# Patient Record
Sex: Female | Born: 1970 | Race: Black or African American | Hispanic: No | Marital: Single | Smoking: Never smoker
Health system: Southern US, Community
[De-identification: ages and names within clinical notes are randomized; demographics above are authoritative.]

## PROBLEM LIST (undated history)

## (undated) DIAGNOSIS — R51 Headache: Secondary | ICD-10-CM

## (undated) DIAGNOSIS — R7611 Nonspecific reaction to tuberculin skin test without active tuberculosis: Secondary | ICD-10-CM

## (undated) DIAGNOSIS — A6000 Herpesviral infection of urogenital system, unspecified: Secondary | ICD-10-CM

## (undated) DIAGNOSIS — R011 Cardiac murmur, unspecified: Secondary | ICD-10-CM

## (undated) DIAGNOSIS — K589 Irritable bowel syndrome without diarrhea: Secondary | ICD-10-CM

## (undated) DIAGNOSIS — R519 Headache, unspecified: Secondary | ICD-10-CM

## (undated) DIAGNOSIS — T7840XA Allergy, unspecified, initial encounter: Secondary | ICD-10-CM

## (undated) HISTORY — DX: Headache: R51

## (undated) HISTORY — DX: Cardiac murmur, unspecified: R01.1

## (undated) HISTORY — DX: Allergy, unspecified, initial encounter: T78.40XA

## (undated) HISTORY — PX: DILATION AND CURETTAGE OF UTERUS: SHX78

## (undated) HISTORY — DX: Headache, unspecified: R51.9

## (undated) HISTORY — DX: Nonspecific reaction to tuberculin skin test without active tuberculosis: R76.11

---

## 2018-02-03 ENCOUNTER — Ambulatory Visit: Payer: BC Managed Care – PPO | Admitting: Family Medicine

## 2018-02-03 VITALS — BP 102/78 | HR 54 | Temp 97.9°F | Ht 66.0 in | Wt 156.0 lb

## 2018-02-03 DIAGNOSIS — J302 Other seasonal allergic rhinitis: Secondary | ICD-10-CM | POA: Diagnosis not present

## 2018-02-03 DIAGNOSIS — Z9229 Personal history of other drug therapy: Secondary | ICD-10-CM

## 2018-02-03 DIAGNOSIS — Z8679 Personal history of other diseases of the circulatory system: Secondary | ICD-10-CM | POA: Diagnosis not present

## 2018-02-03 DIAGNOSIS — Z131 Encounter for screening for diabetes mellitus: Secondary | ICD-10-CM

## 2018-02-03 DIAGNOSIS — Z23 Encounter for immunization: Secondary | ICD-10-CM

## 2018-02-03 DIAGNOSIS — Z0000 Encounter for general adult medical examination without abnormal findings: Secondary | ICD-10-CM

## 2018-02-03 DIAGNOSIS — Z13220 Encounter for screening for lipoid disorders: Secondary | ICD-10-CM

## 2018-02-03 DIAGNOSIS — B009 Herpesviral infection, unspecified: Secondary | ICD-10-CM

## 2018-02-03 DIAGNOSIS — G8929 Other chronic pain: Secondary | ICD-10-CM

## 2018-02-03 DIAGNOSIS — M791 Myalgia, unspecified site: Secondary | ICD-10-CM

## 2018-02-03 DIAGNOSIS — Z113 Encounter for screening for infections with a predominantly sexual mode of transmission: Secondary | ICD-10-CM | POA: Diagnosis not present

## 2018-02-03 DIAGNOSIS — Z862 Personal history of diseases of the blood and blood-forming organs and certain disorders involving the immune mechanism: Secondary | ICD-10-CM

## 2018-02-03 DIAGNOSIS — Z8639 Personal history of other endocrine, nutritional and metabolic disease: Secondary | ICD-10-CM | POA: Diagnosis not present

## 2018-02-03 DIAGNOSIS — D573 Sickle-cell trait: Secondary | ICD-10-CM

## 2018-02-03 DIAGNOSIS — M545 Low back pain: Secondary | ICD-10-CM

## 2018-02-03 LAB — CBC WITH DIFFERENTIAL/PLATELET
Basophils Absolute: 0 10*3/uL (ref 0.0–0.1)
Basophils Relative: 0.9 % (ref 0.0–3.0)
Eosinophils Absolute: 0 10*3/uL (ref 0.0–0.7)
Eosinophils Relative: 1.1 % (ref 0.0–5.0)
HCT: 34.2 % — ABNORMAL LOW (ref 36.0–46.0)
Hemoglobin: 11.3 g/dL — ABNORMAL LOW (ref 12.0–15.0)
LYMPHS ABS: 2.1 10*3/uL (ref 0.7–4.0)
Lymphocytes Relative: 48.7 % — ABNORMAL HIGH (ref 12.0–46.0)
MCHC: 33 g/dL (ref 30.0–36.0)
MCV: 86 fl (ref 78.0–100.0)
Monocytes Absolute: 0.3 10*3/uL (ref 0.1–1.0)
Monocytes Relative: 6 % (ref 3.0–12.0)
NEUTROS ABS: 1.9 10*3/uL (ref 1.4–7.7)
Neutrophils Relative %: 43.3 % (ref 43.0–77.0)
Platelets: 264 10*3/uL (ref 150.0–400.0)
RBC: 3.98 Mil/uL (ref 3.87–5.11)
RDW: 13.3 % (ref 11.5–15.5)
WBC: 4.3 10*3/uL (ref 4.0–10.5)

## 2018-02-03 LAB — BASIC METABOLIC PANEL
BUN: 10 mg/dL (ref 6–23)
CO2: 29 mEq/L (ref 19–32)
Calcium: 9.8 mg/dL (ref 8.4–10.5)
Chloride: 103 mEq/L (ref 96–112)
Creatinine, Ser: 0.79 mg/dL (ref 0.40–1.20)
GFR: 99.65 mL/min (ref >60.00)
Glucose, Bld: 75 mg/dL (ref 70–99)
Potassium: 4.7 mEq/L (ref 3.5–5.1)
Sodium: 139 mEq/L (ref 135–145)

## 2018-02-03 LAB — LIPID PANEL
Cholesterol: 166 mg/dL (ref 0–200)
HDL: 46.6 mg/dL (ref >39.00)
LDL Cholesterol: 106 mg/dL — ABNORMAL HIGH (ref 0–99)
NonHDL: 119.75
Total CHOL/HDL Ratio: 4
Triglycerides: 67 mg/dL (ref 0.0–149.0)
VLDL: 13.4 mg/dL (ref 0.0–40.0)

## 2018-02-03 LAB — VITAMIN D 25 HYDROXY (VIT D DEFICIENCY, FRACTURES): VITD: 28.12 ng/mL — ABNORMAL LOW (ref 30.00–100.00)

## 2018-02-03 LAB — HEMOGLOBIN A1C: Hgb A1c MFr Bld: 4.9 % (ref 4.6–6.5)

## 2018-02-03 NOTE — Patient Instructions (Signed)
Preventive Care 40-64 Years, Female Preventive care refers to lifestyle choices and visits with your health care provider that can promote health and wellness. What does preventive care include?  A yearly physical exam. This is also called an annual well check.  Dental exams once or twice a year.  Routine eye exams. Ask your health care provider how often you should have your eyes checked.  Personal lifestyle choices, including: ? Daily care of your teeth and gums. ? Regular physical activity. ? Eating a healthy diet. ? Avoiding tobacco and drug use. ? Limiting alcohol use. ? Practicing safe sex. ? Taking low-dose aspirin daily starting at age 58. ? Taking vitamin and mineral supplements as recommended by your health care provider. What happens during an annual well check? The services and screenings done by your health care provider during your annual well check will depend on your age, overall health, lifestyle risk factors, and family history of disease. Counseling Your health care provider may ask you questions about your:  Alcohol use.  Tobacco use.  Drug use.  Emotional well-being.  Home and relationship well-being.  Sexual activity.  Eating habits.  Work and work Statistician.  Method of birth control.  Menstrual cycle.  Pregnancy history.  Screening You may have the following tests or measurements:  Height, weight, and BMI.  Blood pressure.  Lipid and cholesterol levels. These may be checked every 5 years, or more frequently if you are over 81 years old.  Skin check.  Lung cancer screening. You may have this screening every year starting at age 78 if you have a 30-pack-year history of smoking and currently smoke or have quit within the past 15 years.  Fecal occult blood test (FOBT) of the stool. You may have this test every year starting at age 65.  Flexible sigmoidoscopy or colonoscopy. You may have a sigmoidoscopy every 5 years or a colonoscopy  every 10 years starting at age 30.  Hepatitis C blood test.  Hepatitis B blood test.  Sexually transmitted disease (STD) testing.  Diabetes screening. This is done by checking your blood sugar (glucose) after you have not eaten for a while (fasting). You may have this done every 1-3 years.  Mammogram. This may be done every 1-2 years. Talk to your health care provider about when you should start having regular mammograms. This may depend on whether you have a family history of breast cancer.  BRCA-related cancer screening. This may be done if you have a family history of breast, ovarian, tubal, or peritoneal cancers.  Pelvic exam and Pap test. This may be done every 3 years starting at age 80. Starting at age 36, this may be done every 5 years if you have a Pap test in combination with an HPV test.  Bone density scan. This is done to screen for osteoporosis. You may have this scan if you are at high risk for osteoporosis.  Discuss your test results, treatment options, and if necessary, the need for more tests with your health care provider. Vaccines Your health care provider may recommend certain vaccines, such as:  Influenza vaccine. This is recommended every year.  Tetanus, diphtheria, and acellular pertussis (Tdap, Td) vaccine. You may need a Td booster every 10 years.  Varicella vaccine. You may need this if you have not been vaccinated.  Zoster vaccine. You may need this after age 5.  Measles, mumps, and rubella (MMR) vaccine. You may need at least one dose of MMR if you were born in  1958 or later. You may also need a second dose.  Pneumococcal 13-valent conjugate (PCV13) vaccine. You may need this if you have certain conditions and were not previously vaccinated.  Pneumococcal polysaccharide (PPSV23) vaccine. You may need one or two doses if you smoke cigarettes or if you have certain conditions.  Meningococcal vaccine. You may need this if you have certain  conditions.  Hepatitis A vaccine. You may need this if you have certain conditions or if you travel or work in places where you may be exposed to hepatitis A.  Hepatitis B vaccine. You may need this if you have certain conditions or if you travel or work in places where you may be exposed to hepatitis B.  Haemophilus influenzae type b (Hib) vaccine. You may need this if you have certain conditions.  Talk to your health care provider about which screenings and vaccines you need and how often you need them. This information is not intended to replace advice given to you by your health care provider. Make sure you discuss any questions you have with your health care provider. Document Released: 03/04/2015 Document Revised: 10/26/2015 Document Reviewed: 12/07/2014 Elsevier Interactive Patient Education  2018 [employer].  Anemia Anemia is a condition in which you do not have enough red blood cells or hemoglobin. Hemoglobin is a substance in red blood cells that carries oxygen. When you do not have enough red blood cells or hemoglobin (are anemic), your body cannot get enough oxygen and your organs may not work properly. As a result, you may feel very tired or have other problems. What are the causes? Common causes of anemia include:  Excessive bleeding. Anemia can be caused by excessive bleeding inside or outside the body, including bleeding from the intestine or from periods in women.  Poor nutrition.  Long-lasting (chronic) kidney, thyroid, and liver disease.  Bone marrow disorders.  Cancer and treatments for cancer.  HIV (human immunodeficiency virus) and AIDS (acquired immunodeficiency syndrome).  Treatments for HIV and AIDS.  Spleen problems.  Blood disorders.  Infections, medicines, and autoimmune disorders that destroy red blood cells.  What are the signs or symptoms? Symptoms of this condition include:  Minor weakness.  Dizziness.  Headache.  Feeling heartbeats  that are irregular or faster than normal (palpitations).  Shortness of breath, especially with exercise.  Paleness.  Cold sensitivity.  Indigestion.  Nausea.  Difficulty sleeping.  Difficulty concentrating.  Symptoms may occur suddenly or develop slowly. If your anemia is mild, you may not have symptoms. How is this diagnosed? This condition is diagnosed based on:  Blood tests.  Your medical history.  A physical exam.  Bone marrow biopsy.  Your health care provider may also check your stool (feces) for blood and may do additional testing to look for the cause of your bleeding. You may also have other tests, including:  Imaging tests, such as a CT scan or MRI.  Endoscopy.  Colonoscopy.  How is this treated? Treatment for this condition depends on the cause. If you continue to lose a lot of blood, you may need to be treated at a hospital. Treatment may include:  Taking supplements of iron, vitamin K93, or folic acid.  Taking a hormone medicine (erythropoietin) that can help to stimulate red blood cell growth.  Having a blood transfusion. This may be needed if you lose a lot of blood.  Making changes to your diet.  Having surgery to remove your spleen.  Follow these instructions at home:  Take over-the-counter  and prescription medicines only as told by your health care provider.  Take supplements only as told by your health care provider.  Follow any diet instructions that you were given.  Keep all follow-up visits as told by your health care provider. This is important. Contact a health care provider if:  You develop new bleeding anywhere in the body. Get help right away if:  You are very weak.  You are short of breath.  You have pain in your abdomen or chest.  You are dizzy or feel faint.  You have trouble concentrating.  You have bloody or black, tarry stools.  You vomit repeatedly or you vomit up blood. Summary  Anemia is a condition in  which you do not have enough red blood cells or enough of a substance in your red blood cells that carries oxygen (hemoglobin).  Symptoms may occur suddenly or develop slowly.  If your anemia is mild, you may not have symptoms.  This condition is diagnosed with blood tests as well as a medical history and physical exam. Other tests may be needed.  Treatment for this condition depends on the cause of the anemia. This information is not intended to replace advice given to you by your health care provider. Make sure you discuss any questions you have with your health care provider. Document Released: 03/15/2004 Document Revised: 03/09/2016 Document Reviewed: 03/09/2016 Elsevier Interactive Patient Education  2018 [employer].  Vitamin D Deficiency Vitamin D deficiency is when your body does not have enough vitamin D. Vitamin D is important to your body for many reasons:  It helps the body to absorb two important minerals, called calcium and phosphorus.  It plays a role in bone health.  It may help to prevent some diseases, such as diabetes and multiple sclerosis.  It plays a role in muscle function, including heart function.  You can get vitamin D by:  Eating foods that naturally contain vitamin D.  Eating or drinking milk or other dairy products that have vitamin D added to them.  Taking a vitamin D supplement or a multivitamin supplement that contains vitamin D.  Being in the sun. Your body naturally makes vitamin D when your skin is exposed to sunlight. Your body changes the sunlight into a form of the vitamin that the body can use.  If vitamin D deficiency is severe, it can cause a condition in which your bones become soft. In adults, this condition is called osteomalacia. In children, this condition is called rickets. What are the causes? Vitamin D deficiency may be caused by:  Not eating enough foods that contain vitamin D.  Not getting enough sun exposure.  Having  certain digestive system diseases that make it difficult for your body to absorb vitamin D. These diseases include Crohn disease, chronic pancreatitis, and cystic fibrosis.  Having a surgery in which a part of the stomach or a part of the small intestine is removed.  Being obese.  Having chronic kidney disease or liver disease.  What increases the risk? This condition is more likely to develop in:  Older people.  People who do not spend much time outdoors.  People who live in a long-term care facility.  People who have had broken bones.  People with weak or thin bones (osteoporosis).  People who have a disease or condition that changes how the body absorbs vitamin D.  People who have dark skin.  People who take certain medicines, such as steroid medicines or certain seizure medicines.  People who are overweight or obese.  What are the signs or symptoms? In mild cases of vitamin D deficiency, there may not be any symptoms. If the condition is severe, symptoms may include:  Bone pain.  Muscle pain.  Falling often.  Broken bones caused by a minor injury.  How is this diagnosed? This condition is usually diagnosed with a blood test. How is this treated? Treatment for this condition may depend on what caused the condition. Treatment options include:  Taking vitamin D supplements.  Taking a calcium supplement. Your health care provider will suggest what dose is best for you.  Follow these instructions at home:  Take medicines and supplements only as told by your health care provider.  Eat foods that contain vitamin D. Choices include: ? Fortified dairy products, cereals, or juices. Fortified means that vitamin D has been added to the food. Check the label on the package to be sure. ? Fatty fish, such as salmon or trout. ? Eggs. ? Oysters.  Do not use a tanning bed.  Maintain a healthy weight. Lose weight, if needed.  Keep all follow-up visits as told by your  health care provider. This is important. Contact a health care provider if:  Your symptoms do not go away.  You feel like throwing up (nausea) or you throw up (vomit).  You have fewer bowel movements than usual or it is difficult for you to have a bowel movement (constipation). This information is not intended to replace advice given to you by your health care provider. Make sure you discuss any questions you have with your health care provider. Document Released: 04/30/2011 Document Revised: 07/20/2015 Document Reviewed: 06/23/2014 Elsevier Interactive Patient Education  2018 [employer].

## 2018-02-03 NOTE — Progress Notes (Signed)
Subjective:     Joanne Henderson is a 47 y.o. female and is here for a comprehensive physical exam. 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  Pt previously seen in [place].  Has a history of "avoiding the doctor's office".  Seasonal allergies: -Symptoms a/w pollen -Typically does not take anything  History of heart murmur: -Endorses negative cardiac work-up in the past -Asymptomatic.  Denies chest pain, dizziness, fatigue  Fibromyalgia?: -Symptoms x3 4 years -Notes extreme sensitivity to touch and easy bruising -Symptoms worse with menses, increase sugar intake, and stress -Had negative work-up in the past -Does not like taking medication. -Tries to deal with the pain.  May take Aleve  HSV 1: -May take Valtrex -Denies cold sores, endorses genital infection -Type close will cause an outbreak -Denies recent outbreak  Back pain, chronic: -Tries to exercise, walk -may take Aleeve or Advil if really bad  Allergies: Penicillin-hives Hydrocodone- vomiting, migraine Avocado, cucumbers, squash, alcohol-nausea Watermelon-vomiting cold meds-hurts stomach after 1 day taking  Social history: Pt is single. She has 2 adult children.  Pt has a PhD in Higher education careers adviser from [place].  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  Pt endorses social alcohol use.  Pt denies tobacco and drug use.  Health maintenance: Last Pap 3 years ago Mammogram-never had Last foot exam-09/30/2017 [facility] Dental-recently got a new dentist Immunizations-TB test 5 years ago, cannot recall tetanus, influenza vaccine 2 years ago.  Requesting an influenza vaccine this visit.  Social History   Socioeconomic History  . Marital status: Single    Spouse  name: Not on file  . Number of children: Not on file  . Years of education: Not on file  . Highest education level: Not on file  Occupational History  . Not on file  Social Needs  . Financial resource strain: Not on file  . Food insecurity:    Worry: Not on file    Inability: Not on file  . Transportation needs:    Medical: Not on file    Non-medical: Not on file  Tobacco Use  . Smoking status: Not on file  Substance and Sexual Activity  . Alcohol use: Not on file  . Drug use: Not on file  . Sexual activity: Not on file  Lifestyle  . Physical activity:    Days per week: Not on file    Minutes per session: Not on file  . Stress: Not on file  Relationships  . Social connections:    Talks on phone: Not on file    Gets together: Not on file    Attends religious service: Not on file    Active member of club or organization: Not on file    Attends meetings of clubs or organizations: Not on file    Relationship status: Not on file  . Intimate partner violence:    Fear of current or ex partner: Not on file    Emotionally abused: Not on file    Physically abused: Not on file    Forced sexual activity: Not on file  Other Topics Concern  . Not on file  Social History Narrative  . Not on file   Health Maintenance  Topic Date Due  . HIV Screening  07/06/1984  . TETANUS/TDAP  07/06/1988  .  PAP SMEAR-Modifier  07/07/1990  . INFLUENZA VACCINE  09/19/2017    The following portions of the patient's history were reviewed and updated as appropriate: allergies, current medications, past family history, past medical history, past social history, past surgical history and problem list.  Review of Systems Pertinent items noted in HPI and remainder of comprehensive ROS otherwise negative.   Objective:    BP 102/78 (BP Location: Left Arm, Patient Position: Sitting, Cuff Size: Normal)   Pulse (!) 54   Temp 97.9 F (36.6 C) (Oral)   Ht 5\' 6"  (1.676 m)   Wt 156 lb (70.8 kg)   SpO2  98%   BMI 25.18 kg/m  General appearance: alert, cooperative and no distress Head: Normocephalic, without obvious abnormality, atraumatic Eyes: conjunctivae/corneas clear. PERRL, EOM's intact. Fundi benign. Ears: normal TM's and external ear canals both ears Nose: Nares normal. Septum midline. Mucosa normal. No drainage or sinus tenderness. Throat: lips, mucosa, and tongue normal; teeth and gums normal Neck: no adenopathy, no carotid bruit, no JVD, supple, symmetrical, trachea midline and thyroid not enlarged, symmetric, no tenderness/mass/nodules Lungs: clear to auscultation bilaterally Heart: regular rate and rhythm, S1, S2 normal, no murmur, click, rub or gallop Abdomen: soft, non-tender; bowel sounds normal; no masses,  no organomegaly Extremities: extremities normal, atraumatic, no cyanosis or edema Skin: Skin color, texture, turgor normal. No rashes or lesions Neurologic: Alert and oriented X 3, normal strength and tone. Normal symmetric reflexes. Normal coordination and gait    Assessment:    Healthy female exam.     Plan:     Anticipatory guidance given including wearing seatbelts, smoke detectors in the home, increasing physical activity, increasing p.o. intake of water and vegetables. -will obtain labs -pt given info to schedule mammogram -will RTC for pap  -given handout -next CPE in 1 yr. See After Visit Summary for Counseling Recommendations    STI screening -will obtain HIV and RPR -will obtain GC with pap  need for immunization against influenza -Influenza vaccine given this visit  Seasonal allergies: -Discussed various treatment options including local honey, saline nasal spray -Can also try OTC allergy meds such as Zyrtec, Claritin, Allegra, etc.  History of cardiac murmur -Not appreciated on exam -Continue to monitor  Chronic back pain -Continue exercising, stretching, heat -Okay to use Aleve or Advil as needed  Sickle cell trait  HSV-1  infection -Currently stable -Antiviral medication PRN  History of iron deficiency anemia -Will obtain CBC -Consider eating iron rich foods  Myalgia -Question of fibromyalgia -Consider turmeric -Okay to use Advil or Aleve as needed. -Continue exercise daily  Follow-up PRN for Pap  Abbe AmsterdamShannon Azlyn Wingler, MD

## 2018-02-04 ENCOUNTER — Encounter: Payer: Self-pay | Admitting: Family Medicine

## 2018-02-04 DIAGNOSIS — M545 Low back pain: Secondary | ICD-10-CM

## 2018-02-04 DIAGNOSIS — M791 Myalgia, unspecified site: Secondary | ICD-10-CM | POA: Insufficient documentation

## 2018-02-04 DIAGNOSIS — J302 Other seasonal allergic rhinitis: Secondary | ICD-10-CM | POA: Insufficient documentation

## 2018-02-04 DIAGNOSIS — Z8679 Personal history of other diseases of the circulatory system: Secondary | ICD-10-CM | POA: Insufficient documentation

## 2018-02-04 DIAGNOSIS — B009 Herpesviral infection, unspecified: Secondary | ICD-10-CM | POA: Insufficient documentation

## 2018-02-04 DIAGNOSIS — Z9229 Personal history of other drug therapy: Secondary | ICD-10-CM | POA: Insufficient documentation

## 2018-02-04 DIAGNOSIS — D573 Sickle-cell trait: Secondary | ICD-10-CM | POA: Insufficient documentation

## 2018-02-04 DIAGNOSIS — G8929 Other chronic pain: Secondary | ICD-10-CM | POA: Insufficient documentation

## 2018-02-04 DIAGNOSIS — Z8639 Personal history of other endocrine, nutritional and metabolic disease: Secondary | ICD-10-CM | POA: Insufficient documentation

## 2018-02-04 DIAGNOSIS — Z862 Personal history of diseases of the blood and blood-forming organs and certain disorders involving the immune mechanism: Secondary | ICD-10-CM | POA: Insufficient documentation

## 2018-02-04 LAB — HIV ANTIBODY (ROUTINE TESTING W REFLEX): HIV 1&2 Ab, 4th Generation: NONREACTIVE

## 2018-02-04 LAB — RPR: RPR Ser Ql: NONREACTIVE

## 2018-02-05 ENCOUNTER — Other Ambulatory Visit: Payer: Self-pay | Admitting: Family Medicine

## 2018-02-05 ENCOUNTER — Encounter: Payer: Self-pay | Admitting: Family Medicine

## 2018-02-05 DIAGNOSIS — E559 Vitamin D deficiency, unspecified: Secondary | ICD-10-CM

## 2018-02-05 DIAGNOSIS — T7840XA Allergy, unspecified, initial encounter: Secondary | ICD-10-CM | POA: Insufficient documentation

## 2018-02-05 DIAGNOSIS — Z1231 Encounter for screening mammogram for malignant neoplasm of breast: Secondary | ICD-10-CM

## 2018-02-05 MED ORDER — VITAMIN D (ERGOCALCIFEROL) 1.25 MG (50000 UNIT) PO CAPS: 50000.0000 [IU] | ORAL_CAPSULE | ORAL | 0 refills | Status: AC

## 2018-02-06 ENCOUNTER — Other Ambulatory Visit: Payer: Self-pay | Admitting: Family Medicine

## 2018-02-06 DIAGNOSIS — E559 Vitamin D deficiency, unspecified: Secondary | ICD-10-CM

## 2018-03-14 ENCOUNTER — Ambulatory Visit
Admission: RE | Admit: 2018-03-14 | Discharge: 2018-03-14 | Disposition: A | Discharge status: Home / Self Care | Payer: BC Managed Care – PPO | Source: Ambulatory Visit | Attending: Family Medicine | Admitting: Family Medicine

## 2018-03-14 DIAGNOSIS — Z1231 Encounter for screening mammogram for malignant neoplasm of breast: Secondary | ICD-10-CM

## 2018-04-11 ENCOUNTER — Other Ambulatory Visit: Payer: BC Managed Care – PPO

## 2018-05-07 ENCOUNTER — Other Ambulatory Visit: Payer: Self-pay | Admitting: Family Medicine

## 2018-05-07 DIAGNOSIS — E559 Vitamin D deficiency, unspecified: Secondary | ICD-10-CM

## 2018-05-13 ENCOUNTER — Other Ambulatory Visit: Payer: Self-pay | Admitting: Family Medicine

## 2018-05-13 DIAGNOSIS — E559 Vitamin D deficiency, unspecified: Secondary | ICD-10-CM

## 2019-04-30 ENCOUNTER — Ambulatory Visit: Payer: BC Managed Care – PPO | Attending: Family

## 2019-04-30 DIAGNOSIS — Z23 Encounter for immunization: Secondary | ICD-10-CM

## 2019-04-30 NOTE — Progress Notes (Signed)
   Covid-19 Vaccination Clinic  Name:  Joanne Henderson    MRN: [id number] DOB: 12-May-1970  04/30/2019  [name] was observed post Covid-19 immunization for 15 minutes without incident. She was provided with Vaccine Information Sheet and instruction to access the V-Safe system.   [name] was instructed to call 911 with any severe reactions post vaccine: Marland Kitchen Difficulty breathing  . Swelling of face and throat  . A fast heartbeat  . A bad rash all over body  . Dizziness and weakness   Immunizations Administered    Name Date Dose VIS Date Route   Moderna COVID-19 Vaccine 04/30/2019  1:23 PM 0.5 mL 01/20/2019 Intramuscular   Manufacturer: Moderna   Lot: [id number]   NDC: 13685-992-34

## 2019-06-02 ENCOUNTER — Ambulatory Visit: Payer: BC Managed Care – PPO | Attending: Internal Medicine

## 2019-07-09 ENCOUNTER — Ambulatory Visit: Payer: BC Managed Care – PPO | Attending: Family

## 2019-07-09 DIAGNOSIS — Z23 Encounter for immunization: Secondary | ICD-10-CM

## 2019-07-09 NOTE — Progress Notes (Signed)
   Covid-19 Vaccination Clinic  Name:  Joanne Henderson    MRN: [id number] DOB: [birth year]  07/09/2019  [name] was observed post Covid-19 immunization for 15 minutes without incident. She was provided with Vaccine Information Sheet and instruction to access the V-Safe system.   [name] was instructed to call 911 with any severe reactions post vaccine: Marland Kitchen Difficulty breathing  . Swelling of face and throat  . A fast heartbeat  . A bad rash all over body  . Dizziness and weakness   Immunizations Administered    Name Date Dose VIS Date Route   Moderna COVID-19 Vaccine 07/09/2019  1:04 PM 0.5 mL 01/2019 Intramuscular   Manufacturer: Moderna   Lot: [id number]   NDC: 69485-462-70

## 2020-01-07 ENCOUNTER — Encounter (HOSPITAL_BASED_OUTPATIENT_CLINIC_OR_DEPARTMENT_OTHER): Payer: Self-pay | Admitting: Obstetrics and Gynecology

## 2020-01-07 NOTE — H&P (Signed)
Joanne Henderson is an 49 y.o. female. w/longstanding history of abnormal uterine bleeding and dysmenorrhea presenting for managment. Has failed multiple medical modalities including a progesterone dermatitis. She had an SIS and EMB that were normal. FSH was 24 and Estradiol was 9 consistent with approaching menopause.She was given Lupron which helped her symptoms. However, she still desires definitive surgical management.   Pertinent Gynecological History: Menses: flow is excessive with use of 12 pads or tampons on heaviest days Bleeding: dysfunctional uterine bleeding Contraception: abstinence DES exposure: denies Blood transfusions: none2 Sexually transmitted diseases: HSV Previous GYN Procedures: none  Last mammogram: normal Date: 2019 Last pap: normal Date: 2021 OB History: G3, P2012 [name]   Menstrual History: No LMP recorded. (Menstrual status: Perimenopausal).    Past Medical History:  Diagnosis Date  . Allergy    SEASONAL/POLLEN  . Frequent headaches   . Genital herpes   . Heart murmur   . IBS (irritable bowel syndrome)   . Positive TB test     History reviewed. No pertinent surgical history.  No family history on file.  Social History:  reports that she has never smoked. She has never used smokeless tobacco. She reports current alcohol use. She reports that she does not use drugs.  Allergies:  Allergies  Allergen Reactions  . Avocado     No medications prior to admission.    Review of Systems  There were no vitals taken for this visit. Physical Exam Gen: well appearing, NAD CV: Reg rate Pulm: NWOB Abd: soft, nondistended, nontender, no masses, no hernia GYN: uterus 8 week size with normal descent and movement, no adnexa ttp/CMT Ext: No edema b/l  No results found for this or any previous visit (from the past 24 hour(s)).  No results found.  TVUS: 8cm uterus w/a few small fibroids EMB benign  Assessment/Plan: 49 yo G2P2  vaginally multiparous patient presents for scheduled TVH, mccalls, culdoplasty, cystoscopy, and possible bilateral salpingectomy for longstanding history of dysmenorrhea and AUB after failing medical medical modalities. Risks discussed including infection, bleeding, damage to surrounding structures, need for additional procedures, failure to help cyclic pain, requiring incisions, postoperative DVT , future prolapse, and unexpected pathology. All questions answered. Consent signed in clinic. Gent/clind on call to OR. She has nausea with all narcotic pain meds and gets a rash with PCN.    Joanne Henderson 01/07/2020, 1:46 PM

## 2020-01-25 NOTE — Progress Notes (Signed)
COVID Vaccine Completed: x2 Date COVID Vaccine completed:  04-30-19 & 07-09-19 COVID vaccine manufacturer: Pfizer    Moderna   [employer]   PCP - Abbe Amsterdam, MD Cardiologist -   Chest x-ray -  EKG -  Stress Test -  ECHO -  Cardiac Cath -  Pacemaker/ICD device last checked:  Sleep Study -  CPAP -   Fasting Blood Sugar -  Checks Blood Sugar _____ times a day  Blood Thinner Instructions: Aspirin Instructions: Last Dose:  Anesthesia review:  Murmur  Patient denies shortness of breath, fever, cough and chest pain at PAT appointment   Patient verbalized understanding of instructions that were given to them at the PAT appointment. Patient was also instructed that they will need to review over the PAT instructions again at home before surgery.

## 2020-01-25 NOTE — Patient Instructions (Signed)
DUE TO COVID-19 ONLY ONE VISITOR IS ALLOWED IN WAITING ROOM (VISITOR WILL HAVE A TEMPERATURE CHECK ON ARRIVAL AND MUST WEAR A FACE MASK THE ENTIRE TIME.)  ONCE YOU ARE ADMITTED TO YOUR PRIVATE ROOM, THE SAME ONE VISITOR IS ALLOWED TO VISIT DURING VISITING HOURS ONLY.  Your COVID swab testing is scheduled for Monday, 02-01-20 at  ,   You must self quarantine after your testing per handout given to you at the testing site. [address] [place], [place] [address]  (Must self quarantine after testing. Follow instructions on handout.)       Your procedure is scheduled on:  Thursday, 02-04-20  Report to [facility] AT 5:30  A. M.   Call this number if you have problems the morning of [phone].   OUR ADDRESS IS [address].  WE ARE LOCATED IN THE [facility].                                     REMEMBER:  DO NOT EAT FOOD OR DRINK LIQUIDS AFTER MIDNIGHT .    BRUSH YOUR TEETH THE MORNING OF SURGERY.  TAKE THESE MEDICATIONS MORNING OF SURGERY WITH A SIP OF WATER:  None  DO NOT WEAR JEWERLY, MAKE UP, OR NAIL POLISH.  DO NOT WEAR LOTIONS, POWDERS, PERFUMES/COLOGNE OR DEODORANT.  DO NOT SHAVE FOR 24 HOURS PRIOR TO DAY OF SURGERY.  CONTACTS, GLASSES, OR DENTURES MAY NOT BE WORN TO SURGERY.                                    [place] IS NOT RESPONSIBLE  FOR ANY BELONGINGS.                                                                    Incentive Spirometer  An incentive spirometer is a tool that can help keep your lungs clear and active. This tool measures how well you are filling your lungs with each breath. Taking long deep breaths may help reverse or decrease the chance of developing breathing (pulmonary) problems (especially infection) following:  A long period of time when you are unable to move or be active. BEFORE THE PROCEDURE   If the spirometer includes an indicator to show your best effort, your nurse or respiratory  therapist will set it to a desired goal.  If possible, sit up straight or lean slightly forward. Try not to slouch.  Hold the incentive spirometer in an upright position. INSTRUCTIONS FOR USE  1. Sit on the edge of your bed if possible, or sit up as far as you can in bed or on a chair. 2. Hold the incentive spirometer in an upright position. 3. Breathe out normally. 4. Place the mouthpiece in your mouth and seal your lips tightly around it. 5. Breathe in slowly and as deeply as possible, raising the piston or the ball toward the top of the column. 6. Hold your breath for 3-5 seconds or for as long as possible. Allow the piston or ball to fall to the bottom of the column. 7.  Remove the mouthpiece from your mouth and breathe out normally. 8. Rest for a few seconds and repeat Steps 1 through 7 at least 10 times every 1-2 hours when you are awake. Take your time and take a few normal breaths between deep breaths. 9. The spirometer may include an indicator to show your best effort. Use the indicator as a goal to work toward during each repetition. 10. After each set of 10 deep breaths, practice coughing to be sure your lungs are clear. If you have an incision (the cut made at the time of surgery), support your incision when coughing by placing a pillow or rolled up towels firmly against it. Once you are able to get out of bed, walk around indoors and cough well. You may stop using the incentive spirometer when instructed by your caregiver.  RISKS AND COMPLICATIONS  Take your time so you do not get dizzy or light-headed.  If you are in pain, you may need to take or ask for pain medication before doing incentive spirometry. It is harder to take a deep breath if you are having pain. AFTER USE  Rest and breathe slowly and easily.  It can be helpful to keep track of a log of your progress. Your caregiver can provide you with a simple table to help with this. If you are using the spirometer at home,  follow these instructions: SEEK MEDICAL CARE IF:   You are having difficultly using the spirometer.  You have trouble using the spirometer as often as instructed.  Your pain medication is not giving enough relief while using the spirometer.  You develop fever of 100.5 F (38.1 C) or higher. SEEK IMMEDIATE MEDICAL CARE IF:   You cough up bloody sputum that had not been present before.  You develop fever of 102 F (38.9 C) or greater.  You develop worsening pain at or near the incision site. MAKE SURE YOU:   Understand these instructions.  Will watch your condition.  Will get help right away if you are not doing well or get worse. Document Released: 06/18/2006 Document Revised: 04/30/2011 Document Reviewed: 08/19/2006 Exit[employer] Patient Information 2014 Exit[employer], [place].   ________________________________________________________________________  WHAT IS A BLOOD TRANSFUSION? Blood Transfusion Information  A transfusion is the replacement of blood or some of its parts. Blood is made up of multiple cells which provide different functions.  Red blood cells carry oxygen and are used for blood loss replacement.  White blood cells fight against infection.  Platelets control bleeding.  Plasma helps clot blood.  Other blood products are available for specialized needs, such as hemophilia or other clotting disorders. BEFORE THE TRANSFUSION  Who gives blood for transfusions?   Healthy volunteers who are fully evaluated to make sure their blood is safe. This is blood bank blood. Transfusion therapy is the safest it has ever been in the practice of medicine. Before blood is taken from a donor, a complete history is taken to make sure that person has no history of diseases nor engages in risky social behavior (examples are intravenous drug use or sexual activity with multiple partners). The donor's travel history is screened to minimize risk of transmitting infections, such as malaria.  The donated blood is tested for signs of infectious diseases, such as HIV and hepatitis. The blood is then tested to be sure it is compatible with you in order to minimize the chance of a transfusion reaction. If you or a relative donates blood, this is often done in anticipation  of surgery and is not appropriate for emergency situations. It takes many days to process the donated blood. RISKS AND COMPLICATIONS Although transfusion therapy is very safe and saves many lives, the main dangers of transfusion include:   Getting an infectious disease.  Developing a transfusion reaction. This is an allergic reaction to something in the blood you were given. Every precaution is taken to prevent this. The decision to have a blood transfusion has been considered carefully by your caregiver before blood is given. Blood is not given unless the benefits outweigh the risks. AFTER THE TRANSFUSION  Right after receiving a blood transfusion, you will usually feel much better and more energetic. This is especially true if your red blood cells have gotten low (anemic). The transfusion raises the level of the red blood cells which carry oxygen, and this usually causes an energy increase.  The nurse administering the transfusion will monitor you carefully for complications. HOME CARE INSTRUCTIONS  No special instructions are needed after a transfusion. You may find your energy is better. Speak with your caregiver about any limitations on activity for underlying diseases you may have. SEEK MEDICAL CARE IF:   Your condition is not improving after your transfusion.  You develop redness or irritation at the intravenous (IV) site. SEEK IMMEDIATE MEDICAL CARE IF:  Any of the following symptoms occur over the next 12 hours:  Shaking chills.  You have a temperature by mouth above 102 F (38.9 C), not controlled by medicine.  Chest, back, or muscle pain.  People around you feel you are not acting correctly or are  confused.  Shortness of breath or difficulty breathing.  Dizziness and fainting.  You get a rash or develop hives.  You have a decrease in urine output.  Your urine turns a dark color or changes to pink, red, or brown. Any of the following symptoms occur over the next 10 days:  You have a temperature by mouth above 102 F (38.9 C), not controlled by medicine.  Shortness of breath.  Weakness after normal activity.  The white part of the eye turns yellow (jaundice).  You have a decrease in the amount of urine or are urinating less often.  Your urine turns a dark color or changes to pink, red, or brown. Document Released: 02/03/2000 Document Revised: 04/30/2011 Document Reviewed: 09/22/2007 [facility] Patient Information 2014 [place], [place].  _______________________________________________________________________

## 2020-01-26 ENCOUNTER — Encounter (HOSPITAL_COMMUNITY)
Admission: RE | Admit: 2020-01-26 | Discharge: 2020-01-26 | Disposition: A | Discharge status: Home / Self Care | Payer: BC Managed Care – PPO | Source: Ambulatory Visit | Attending: Obstetrics and Gynecology | Admitting: Obstetrics and Gynecology

## 2020-01-26 NOTE — Progress Notes (Signed)
Spoke to patient regarding her PAT appointment today and she stated that surgery has been canceled.  She was advised to notify surgeon's office.

## 2020-02-04 ENCOUNTER — Ambulatory Visit (HOSPITAL_BASED_OUTPATIENT_CLINIC_OR_DEPARTMENT_OTHER)
Admission: RE | Admit: 2020-02-04 | Payer: BC Managed Care – PPO | Source: Home / Self Care | Admitting: Obstetrics and Gynecology

## 2020-02-04 ENCOUNTER — Encounter (HOSPITAL_BASED_OUTPATIENT_CLINIC_OR_DEPARTMENT_OTHER): Admission: RE | Payer: Self-pay | Source: Home / Self Care

## 2020-02-04 HISTORY — DX: Irritable bowel syndrome without diarrhea: K58.9

## 2020-02-04 HISTORY — DX: Herpesviral infection of urogenital system, unspecified: A60.00

## 2020-02-04 SURGERY — HYSTERECTOMY, VAGINAL
Anesthesia: Choice

## 2021-07-26 ENCOUNTER — Ambulatory Visit (INDEPENDENT_AMBULATORY_CARE_PROVIDER_SITE_OTHER): Payer: BC Managed Care – PPO | Admitting: Family Medicine

## 2021-07-26 ENCOUNTER — Ambulatory Visit (INDEPENDENT_AMBULATORY_CARE_PROVIDER_SITE_OTHER): Payer: BC Managed Care – PPO

## 2021-07-26 VITALS — BP 118/78 | HR 61 | Temp 98.5°F | Ht 66.0 in | Wt 173.0 lb

## 2021-07-26 DIAGNOSIS — D573 Sickle-cell trait: Secondary | ICD-10-CM | POA: Diagnosis not present

## 2021-07-26 DIAGNOSIS — M5442 Lumbago with sciatica, left side: Secondary | ICD-10-CM

## 2021-07-26 DIAGNOSIS — R635 Abnormal weight gain: Secondary | ICD-10-CM

## 2021-07-26 DIAGNOSIS — G8929 Other chronic pain: Secondary | ICD-10-CM

## 2021-07-26 DIAGNOSIS — Z8639 Personal history of other endocrine, nutritional and metabolic disease: Secondary | ICD-10-CM | POA: Diagnosis not present

## 2021-07-26 DIAGNOSIS — G43909 Migraine, unspecified, not intractable, without status migrainosus: Secondary | ICD-10-CM

## 2021-07-26 DIAGNOSIS — Z862 Personal history of diseases of the blood and blood-forming organs and certain disorders involving the immune mechanism: Secondary | ICD-10-CM

## 2021-07-26 DIAGNOSIS — Z1159 Encounter for screening for other viral diseases: Secondary | ICD-10-CM

## 2021-07-26 LAB — CBC WITH DIFFERENTIAL/PLATELET
Basophils Absolute: 0 10*3/uL (ref 0.0–0.1)
Basophils Relative: 0.7 % (ref 0.0–3.0)
Eosinophils Absolute: 0.1 10*3/uL (ref 0.0–0.7)
Eosinophils Relative: 1.5 % (ref 0.0–5.0)
HCT: 36.2 % (ref 36.0–46.0)
Hemoglobin: 11.9 g/dL — ABNORMAL LOW (ref 12.0–15.0)
Lymphocytes Relative: 38.3 % (ref 12.0–46.0)
Lymphs Abs: 1.7 10*3/uL (ref 0.7–4.0)
MCHC: 32.8 g/dL (ref 30.0–36.0)
MCV: 86.5 fl (ref 78.0–100.0)
Monocytes Absolute: 0.2 10*3/uL (ref 0.1–1.0)
Monocytes Relative: 4.8 % (ref 3.0–12.0)
Neutro Abs: 2.4 10*3/uL (ref 1.4–7.7)
Neutrophils Relative %: 54.7 % (ref 43.0–77.0)
Platelets: 244 10*3/uL (ref 150.0–400.0)
RBC: 4.19 Mil/uL (ref 3.87–5.11)
RDW: 12.9 % (ref 11.5–15.5)
WBC: 4.4 10*3/uL (ref 4.0–10.5)

## 2021-07-26 LAB — LIPID PANEL
Cholesterol: 200 mg/dL (ref 0–200)
HDL: 49.1 mg/dL (ref >39.00)
LDL Cholesterol: 137 mg/dL — ABNORMAL HIGH (ref 0–99)
NonHDL: 151.38
Total CHOL/HDL Ratio: 4
Triglycerides: 70 mg/dL (ref 0.0–149.0)
VLDL: 14 mg/dL (ref 0.0–40.0)

## 2021-07-26 LAB — COMPREHENSIVE METABOLIC PANEL
ALT: 34 U/L (ref 0–35)
AST: 20 U/L (ref 0–37)
Albumin: 5 g/dL (ref 3.5–5.2)
Alkaline Phosphatase: 78 U/L (ref 39–117)
BUN: 13 mg/dL (ref 6–23)
CO2: 27 mEq/L (ref 19–32)
Calcium: 10.2 mg/dL (ref 8.4–10.5)
Chloride: 104 mEq/L (ref 96–112)
Creatinine, Ser: 0.88 mg/dL (ref 0.40–1.20)
GFR: 75.75 mL/min (ref >60.00)
Glucose, Bld: 88 mg/dL (ref 70–99)
Potassium: 4.7 mEq/L (ref 3.5–5.1)
Sodium: 138 mEq/L (ref 135–145)
Total Bilirubin: 0.5 mg/dL (ref 0.2–1.2)
Total Protein: 8 g/dL (ref 6.0–8.3)

## 2021-07-26 LAB — VITAMIN D 25 HYDROXY (VIT D DEFICIENCY, FRACTURES): VITD: 45.75 ng/mL (ref 30.00–100.00)

## 2021-07-26 LAB — T4, FREE: Free T4: 0.75 ng/dL (ref 0.60–1.60)

## 2021-07-26 LAB — HEMOGLOBIN A1C: Hgb A1c MFr Bld: 5.5 % (ref 4.6–6.5)

## 2021-07-26 LAB — TSH: TSH: 0.84 u[IU]/mL (ref 0.35–5.50)
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
## 2021-07-27 LAB — HEPATITIS C ANTIBODY
Hepatitis C Ab: NONREACTIVE
SIGNAL TO CUT-OFF: 0.04 (ref <1.00)

## 2022-09-15 IMAGING — DX DG LUMBAR SPINE COMPLETE 4+V
5 series · 5 of 5 positions shown · non-contrast
Comparison: None Available.

CLINICAL DATA: Low back pain.

EXAM:
LUMBAR SPINE - COMPLETE 4+ VIEW

[lumbar spine ap]
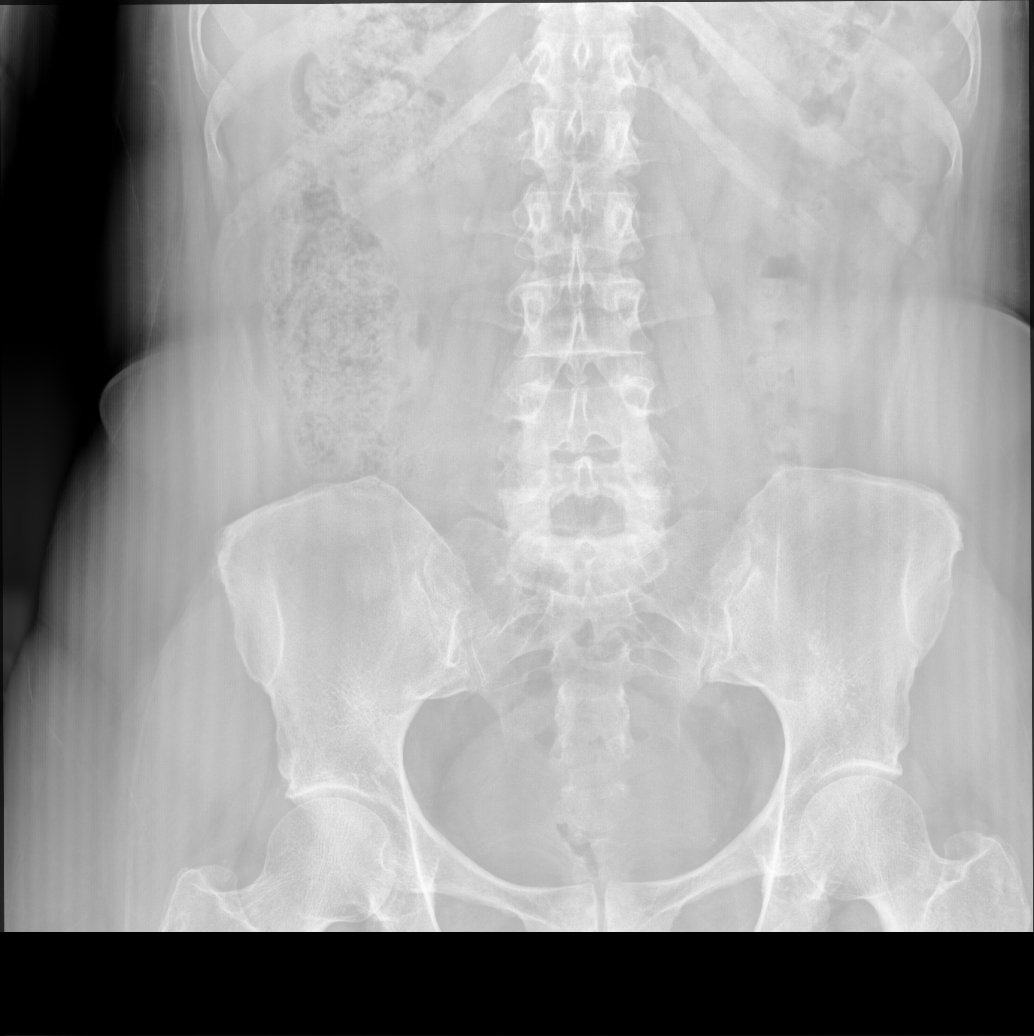

[lumbar spine oblique (1 of 2)]
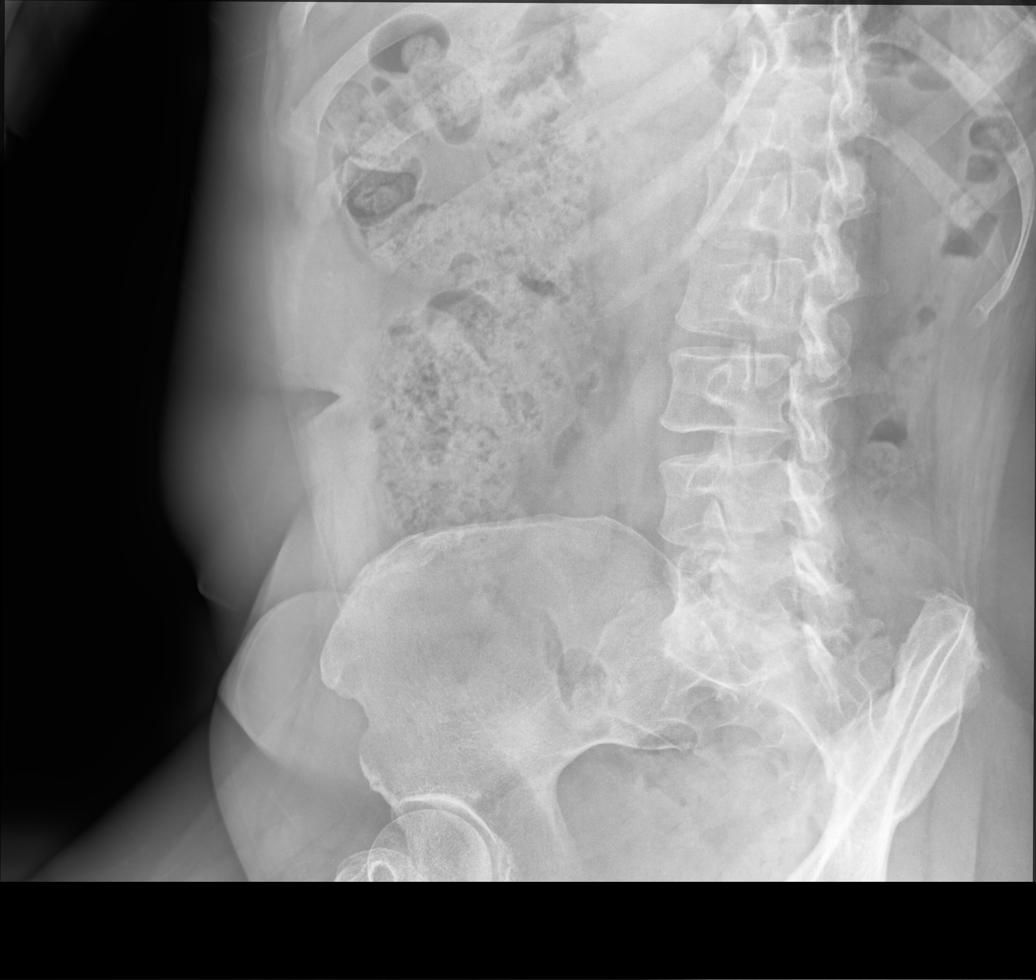

[lumbar spine oblique (2 of 2)]
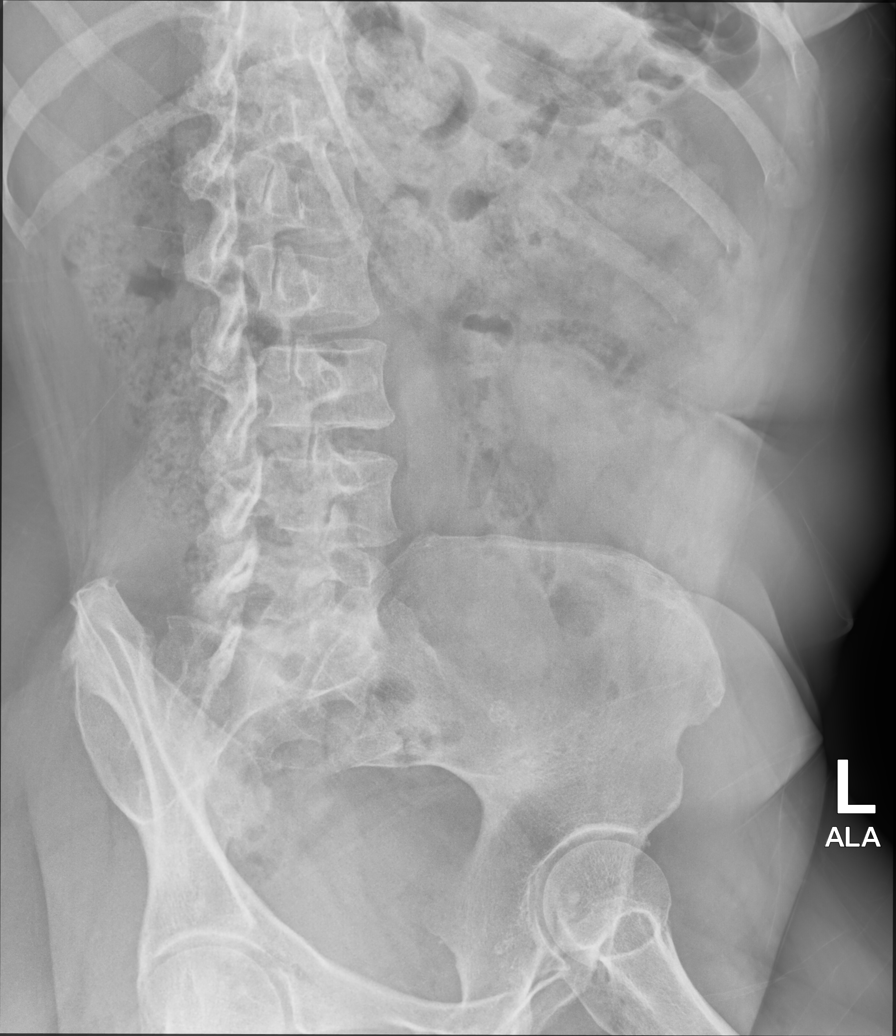

[lumbar spine lat (1 of 2)]
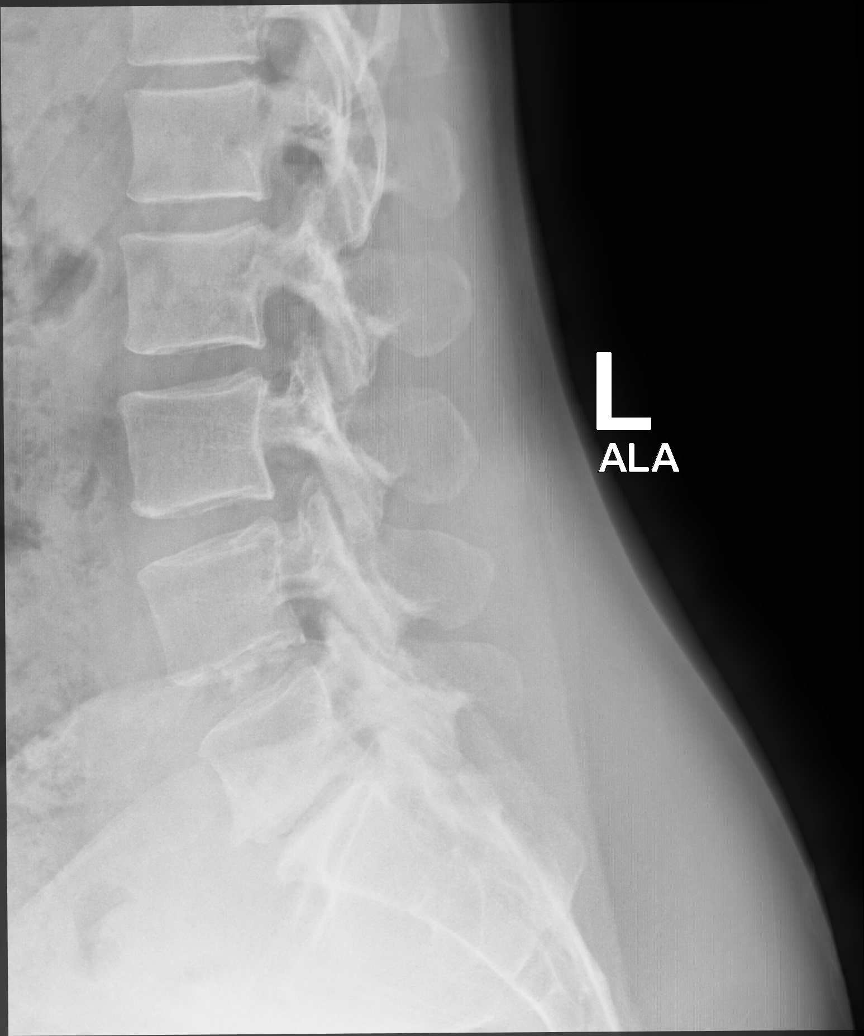

[lumbar spine lat (2 of 2)]
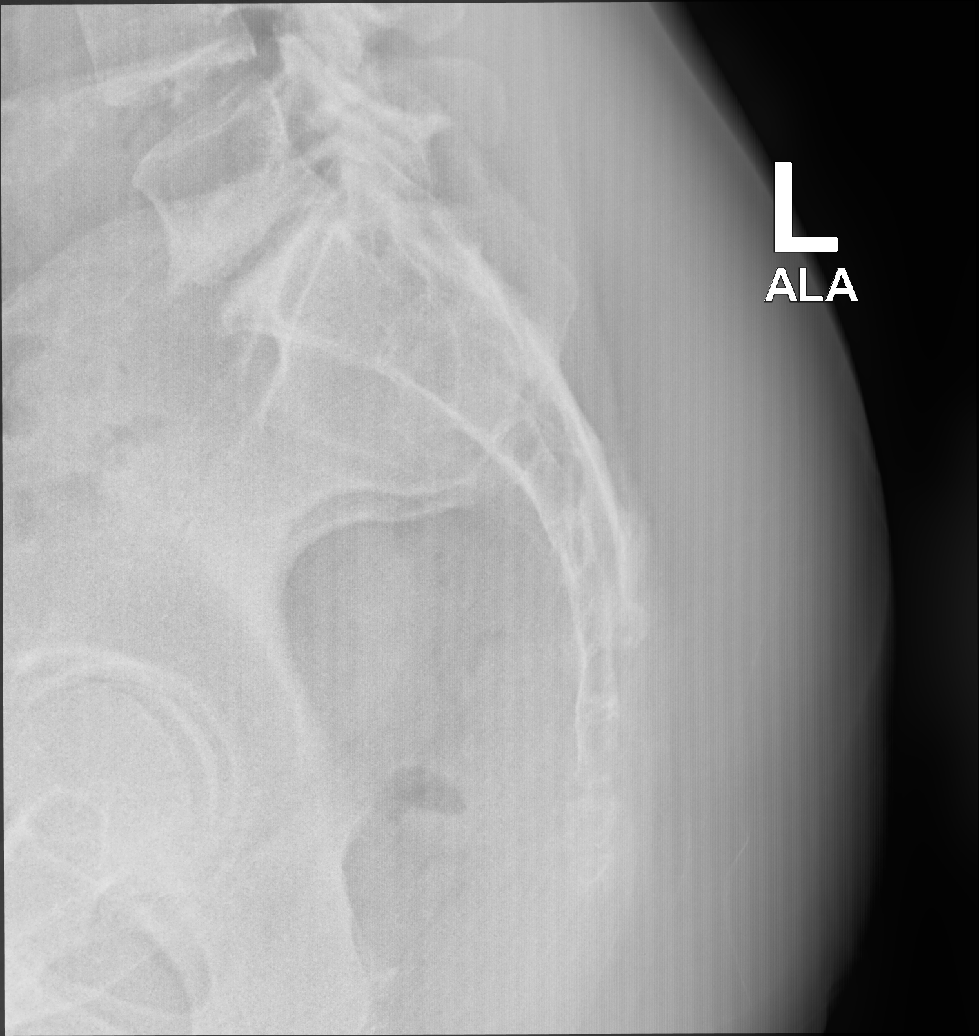

[5 of 5 positions shown; findings below may reference images not displayed]

FINDINGS: There is no evidence of lumbar spine fracture. Alignment is normal.
Moderate degenerative joint changes of L5-S1 with narrowed joint
space, osteophyte formation and facet joint sclerosis are
identified. Mild facet joint sclerosis is identified at L4-5.
IMPRESSION: Moderate degenerative joint changes of L5-S1.

## 2022-11-02 ENCOUNTER — Ambulatory Visit: Payer: BC Managed Care – PPO | Admitting: Adult Health

## 2022-11-02 ENCOUNTER — Encounter: Payer: Self-pay | Admitting: Adult Health

## 2022-11-02 VITALS — BP 130/88 | HR 64 | Temp 98.5°F | Ht 66.0 in | Wt 179.0 lb

## 2022-11-02 DIAGNOSIS — R635 Abnormal weight gain: Secondary | ICD-10-CM | POA: Diagnosis not present

## 2022-11-02 DIAGNOSIS — R6889 Other general symptoms and signs: Secondary | ICD-10-CM | POA: Diagnosis not present

## 2022-11-02 DIAGNOSIS — J302 Other seasonal allergic rhinitis: Secondary | ICD-10-CM

## 2022-11-02 LAB — POCT INFLUENZA A/B
Influenza A, POC: NEGATIVE
Influenza B, POC: NEGATIVE

## 2022-11-02 LAB — TSH: TSH: 0.78 u[IU]/mL (ref 0.35–5.50)

## 2022-11-02 LAB — POCT RAPID STREP A (OFFICE): Rapid Strep A Screen: NEGATIVE

## 2022-11-02 LAB — POC COVID19 BINAXNOW: SARS Coronavirus 2 Ag: NEGATIVE

## 2022-11-02 NOTE — Progress Notes (Signed)
Subjective:    Patient ID: Joanne Henderson, female    DOB: 08/14/1970, 52 y.o.   MRN: [id number]  HPI 52 year old female who  has a past medical history of Allergy, Frequent headaches, Genital herpes, Heart murmur, IBS (irritable bowel syndrome), and Positive TB test.  52 year old female, patient of Dr. Salomon Fick who I am seeing today for an acute issue.  She reports that over the last week or so she has had a cough, body aches, headaches, sore throat.  She does report that she has chronic body aches.  She started using Zyrtec and Flonase over the last few days and this has helped with her symptoms.  She not has not had any fevers, chills, shortness of breath.  There are more she would like to have her thyroid checked.  She is overdue for her physical exam with her PCP and wanted to have her complete physical exam done today as well as a prescription for gabapentin for suspected fibromyalgia.   Review of Systems See HPI   Past Medical History:  Diagnosis Date   Allergy    SEASONAL/POLLEN   Frequent headaches    Genital herpes    Heart murmur    IBS (irritable bowel syndrome)    Positive TB test     Social History   Socioeconomic History   Marital status: Single    Spouse name: Not on file   Number of children: Not on file   Years of education: Not on file   Highest education level: Not on file  Occupational History   Not on file  Tobacco Use   Smoking status: Never   Smokeless tobacco: Never  Substance and Sexual Activity   Alcohol use: Yes   Drug use: Never   Sexual activity: Yes  Other Topics Concern   Not on file  Social History Narrative   Not on file   Social Determinants of Health   Financial Resource Strain: Not on file  Food Insecurity: Not on file  Transportation Needs: Not on file  Physical Activity: Not on file  Stress: Not on file  Social Connections: Not on file  Intimate Partner Violence: Not on file    Past Surgical History:  Procedure  Laterality Date   DILATION AND CURETTAGE OF UTERUS     MAB    Family History  Problem Relation Age of Onset   Diabetes Father    High Cholesterol Father    Diabetes Paternal Grandfather    High Cholesterol Paternal Grandfather     Allergies  Allergen Reactions   Avocado    Codeine Nausea Only    Current Outpatient Medications on File Prior to Visit  Medication Sig Dispense Refill   sertraline (ZOLOFT) 25 MG tablet Take 25 mg by mouth daily.     Vitamin D, Ergocalciferol, (DRISDOL) 1.25 MG (50000 UT) CAPS capsule Take 1 capsule (50,000 Units total) by mouth every 7 (seven) days. 12 capsule 0   No current facility-administered medications on file prior to visit.    BP 130/88   Pulse 64   Temp 98.5 F (36.9 C) (Oral)   Ht 5\' 6"  (1.676 m)   Wt 179 lb (81.2 kg)   SpO2 97%   BMI 28.89 kg/m       Objective:   Physical Exam Vitals and nursing note reviewed.  Constitutional:      Appearance: Normal appearance.  HENT:     Nose: No congestion or rhinorrhea.  Right Turbinates: Not enlarged or swollen.     Left Turbinates: Not enlarged or swollen.     Mouth/Throat:     Pharynx: Oropharynx is clear. Uvula midline. No pharyngeal swelling, oropharyngeal exudate, posterior oropharyngeal erythema or uvula swelling.     Tonsils: No tonsillar exudate or tonsillar abscesses.  Cardiovascular:     Rate and Rhythm: Normal rate and regular rhythm.     Pulses: Normal pulses.     Heart sounds: Normal heart sounds.  Pulmonary:     Effort: Pulmonary effort is normal.     Breath sounds: Normal breath sounds.  Musculoskeletal:        General: Normal range of motion.  Skin:    General: Skin is warm and dry.  Neurological:     General: No focal deficit present.     Mental Status: She is alert and oriented to person, place, and time.  Psychiatric:        Mood and Affect: Mood normal.        Thought Content: Thought content normal.        Judgment: Judgment normal.         Assessment & Plan:  1. Flu-like symptoms  - POC COVID-19 BinaxNow- negative - POCT rapid strep A- negative - POC Influenza - negative  2. Seasonal allergies - Likely symptoms she is experiencing is from allergies. Continue with OTC allergy medication   3. Weight gain -She was advised that she will need to follow-up with her PCP for her routine annual exam which she questioned on why she would need to do so.  Explained it was policy and that helped with continuity of care.  I also advised that she would need to follow-up with her PCP to discuss gabapentin.  Check TSH and if abnormal route to her PCP - TSH; Future - TSH   Joanne Frees, NP

## 2023-04-29 ENCOUNTER — Encounter: Payer: Self-pay | Admitting: Emergency Medicine

## 2023-04-29 ENCOUNTER — Ambulatory Visit
Admission: EM | Admit: 2023-04-29 | Discharge: 2023-04-29 | Disposition: A | Discharge status: Home / Self Care | Attending: Family Medicine | Admitting: Family Medicine

## 2023-04-29 DIAGNOSIS — J111 Influenza due to unidentified influenza virus with other respiratory manifestations: Secondary | ICD-10-CM

## 2023-04-29 LAB — POC COVID19/FLU A&B COMBO
Covid Antigen, POC: NEGATIVE
Influenza A Antigen, POC: NEGATIVE
Influenza B Antigen, POC: NEGATIVE

## 2023-04-29 MED ORDER — OSELTAMIVIR PHOSPHATE 75 MG PO CAPS: 75.0000 mg | ORAL_CAPSULE | Freq: Two times a day (BID) | ORAL | 0 refills | Status: DC

## 2023-04-29 MED ORDER — PROMETHAZINE-DM 6.25-15 MG/5ML PO SYRP: 5.0000 mL | ORAL_SOLUTION | Freq: Four times a day (QID) | ORAL | 0 refills | Status: DC | PRN

## 2023-04-29 NOTE — Discharge Instructions (Signed)
 Treating you for an influenza-like illness. You are considered contagious to others as long as you have a measurable fever with a temperature 100 F.  You should consider yourself infectious until you are fever free for 24 hours without fever lowering medications. Continue to alternate Tylenol and ibuprofen for management of fever.  Force fluids to maintain hydration. Tamiflu twice daily for the next 5 days to reduce symptoms and course of influenza virus.   If you develop any shortness of breath, wheezing or difficulty breathing go immediately to the nearest emergency department.

## 2023-04-29 NOTE — ED Provider Notes (Signed)
 [facility]    CSN: [id number] Arrival date & time: 04/29/23  4034      History   Chief Complaint Chief Complaint  Patient presents with   Sore Throat   Cough    HPI [facility] is a 53 y.o. female.   Patient here today with sore throat, cough, chills, body aches.  Patient reports she began to feel ill yesterday.  She has recently traveled via airplane.  She has taken ibuprofen and NyQuil for symptom management without significant improvement.  Denies any history of asthma or difficulty breathing.   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 Authorizing Provider  oseltamivir (TAMIFLU) 75 MG capsule Take 1 capsule (75 mg total) by mouth 2 (two) times daily. 04/29/23  Yes Bing Neighbors, NP  promethazine-dextromethorphan (PROMETHAZINE-DM) 6.25-15 MG/5ML syrup Take 5 mLs by mouth 4 (four) times daily as needed for cough. 04/29/23  Yes Bing Neighbors, NP  sertraline (ZOLOFT) 25 MG tablet Take 25 mg by mouth  daily. Patient not taking: Reported on 04/29/2023 03/10/21   [provider]  Vitamin D, Ergocalciferol, (DRISDOL) 1.25 MG (50000 UT) CAPS capsule Take 1 capsule (50,000 Units total) by mouth every 7 (seven) days. 02/05/18   Deeann Saint, MD    Family History Family History  Problem Relation Age of Onset   Diabetes Father    High Cholesterol Father    Diabetes Paternal Grandfather    High Cholesterol Paternal Grandfather     Social History Social History   Tobacco Use   Smoking status: Never   Smokeless tobacco: Never  Substance Use Topics   Alcohol use: Yes   Drug use: Never     Allergies   Avocado, Codeine, and Penicillin g   Review of Systems Review of Systems  Respiratory:  Positive for cough.      Physical Exam Triage Vital Signs ED Triage Vitals  Encounter Vitals Group     BP 04/29/23 0838 (!) 139/97     Systolic BP Percentile --      Diastolic BP Percentile --      Pulse  Rate 04/29/23 0838 85     Resp 04/29/23 0838 17     Temp 04/29/23 0838 98 F (36.7 C)     Temp Source 04/29/23 0838 Oral     SpO2 04/29/23 0838 95 %     Weight --      Height --      Head Circumference --      Peak Flow --      Pain Score 04/29/23 0841 5     Pain Loc --      Pain Education --      Exclude from Growth Chart --    No data found.  Updated Vital Signs BP (!) 139/97 (BP Location: Right Arm)   Pulse 85   Temp 98 F (36.7 C) (Oral)   Resp 17   SpO2 95%   Visual Acuity Right Eye Distance:   Left Eye Distance:   Bilateral Distance:    Right Eye Near:   Left Eye Near:    Bilateral Near:     Physical Exam Vitals reviewed.  Constitutional:      Appearance: She is well-developed. She is ill-appearing.  HENT:     Head: Normocephalic and atraumatic.     Nose: Congestion present. No rhinorrhea.     Mouth/Throat:     Mouth: Mucous membranes are dry. No oral lesions.     Pharynx: Pharyngeal swelling, posterior oropharyngeal erythema and uvula  swelling present. No oropharyngeal exudate.     Tonsils: 0 on the right. 0 on the left.  Eyes:     Conjunctiva/sclera: Conjunctivae normal.  Cardiovascular:     Rate and Rhythm: Normal rate and regular rhythm.  Musculoskeletal:     Cervical back: Normal range of motion and neck supple.  Skin:    General: Skin is warm and dry.  Neurological:     General: No focal deficit present.     Mental Status: She is alert and oriented to person, place, and time.      UC Treatments / Results  Labs (all labs ordered are listed, but only abnormal results are displayed) Labs Reviewed  POC COVID19/FLU A&B COMBO    EKG   Radiology No results found.  Procedures Procedures (including critical care time)  Medications Ordered in UC Medications - No data to display  Initial Impression / Assessment and Plan / UC Course  I have reviewed the triage vital signs and the nursing notes.  Pertinent labs & imaging results that were available during my care of the patient were reviewed by me and considered in my medical decision making (see chart for details).    Influenza-like Illness, rapid COVID/flu test negative however given symptoms and symptoms have been present for less than 24 hours highly suspect influenza as a source of current symptoms.  Tamiflu treatment dose prescribed.  Promethazine DM prescribed for cough and congestion.  Continue ibuprofen/Tylenol as needed for fever and body aches.  Return precautions given if symptoms worsen or do not improve. Final Clinical Impressions(s) / UC Diagnoses   Final diagnoses:  Influenza-like illness     Discharge Instructions      Treating you for an influenza-like illness. You are considered contagious to others as long as you have a measurable fever with a temperature 100 F.  You should consider yourself infectious until you are fever free for 24 hours without fever lowering medications. Continue to alternate Tylenol and ibuprofen for  management of fever.   Force fluids to maintain hydration. Tamiflu twice  daily for the next 5 days to reduce symptoms and course of influenza virus.   If you develop any shortness of breath, wheezing or difficulty breathing go immediately to the nearest emergency department.        ED Prescriptions     Medication Sig Dispense Auth. Provider   oseltamivir (TAMIFLU) 75 MG capsule Take 1 capsule (75 mg total) by mouth 2 (two) times daily. 10 capsule Bing Neighbors, NP   promethazine-dextromethorphan (PROMETHAZINE-DM) 6.25-15 MG/5ML syrup Take 5 mLs by mouth 4 (four) times daily as needed for cough. 180 mL Bing Neighbors, NP      PDMP not reviewed this encounter.   Bing Neighbors, NP 04/29/23 [phone]

## 2023-04-29 NOTE — ED Triage Notes (Signed)
 Pt c/o sore throat, cough, chills, body aches. She flew on Tuesday and came back yesterday not feeling well.

## 2024-02-04 ENCOUNTER — Telehealth: Payer: Self-pay

## 2024-02-04 ENCOUNTER — Encounter: Payer: Self-pay | Admitting: Family Medicine

## 2024-02-04 ENCOUNTER — Telehealth: Admitting: Family Medicine

## 2024-02-04 VITALS — Ht 66.5 in | Wt 177.0 lb

## 2024-02-04 DIAGNOSIS — J069 Acute upper respiratory infection, unspecified: Secondary | ICD-10-CM

## 2024-02-04 NOTE — Telephone Encounter (Signed)
 Attempted to call patient to get set up for virtual appointment. Voicemail was left to call office back to get set up for visit.   Attempt #1

## 2024-02-04 NOTE — Progress Notes (Signed)
 Virtual Visit via Video Note I connected with [facility] on 02/04/2024 by a video enabled telemedicine application and verified that I am speaking with the correct person using two identifiers. Location patient: home Location provider:work office Persons participating in the virtual visit: patient, provider  I discussed the limitations of evaluation and management by telemedicine and the availability of in person appointments. The patient expressed understanding and agreed to proceed.  Chief Complaint  Patient presents with   Cough    Mucinex is helping calm it down. Started noticing Saturday night. Sunday when she woke up she really noticed it. Had fever, body aches. Sore throat. Has not done any Covid or Flu testing.    [facility] is a 53 year old female with past medical history significant for back pain, seasonal allergies, and sickle cell trait complaining of 2 days of respiratory symptoms as described above. For today she had severe body aches and fever, and 100.1 F. She has not had a fever today. Headache, nasal congestion, rhinorrhea, sore throat, productive cough with yellowish sputum, and postnasal drainage. Negative for dyspnea, wheezing, abdominal pain, nausea, vomiting, urinary symptoms, skin rash, or edema. She has taking Mucinex and NyQuil. She is concerned about the cough, during prior viral illnesses she had cough that lasted long time. She is planning on flying this coming Monday. No sick contact. She has not performed COVID or flu test at home.  ROS: See pertinent positives and negatives per HPI.  Past Medical History:  Diagnosis Date   Allergy    SEASONAL/POLLEN   Frequent headaches    Genital herpes    Heart murmur    IBS (irritable bowel syndrome)    Positive TB test     Past Surgical History:  Procedure Laterality Date   DILATION AND CURETTAGE OF UTERUS     MAB    Family History  Problem Relation Age of Onset   Diabetes Father     High Cholesterol Father    Diabetes Paternal Grandfather    High Cholesterol Paternal Grandfather     Social History   Socioeconomic History   Marital status: Single    Spouse name: Not on file   Number of children: Not on file   Years of education: Not on file   Highest education level: Not on file  Occupational History   Not on file  Tobacco Use   Smoking status: Never   Smokeless tobacco: Never  Substance and Sexual Activity   Alcohol use: Yes   Drug use: Never   Sexual activity: Yes  Other Topics Concern   Not on file  Social History Narrative   Not on file   Social Drivers of Health   Tobacco Use: Low Risk (02/04/2024)   Patient History    Smoking Tobacco Use: Never    Smokeless Tobacco Use: Never    Passive Exposure: Not on file  Financial Resource Strain: Not on file  Food Insecurity: Not on file  Transportation Needs: Not on file  Physical Activity: Not on file  Stress: Not on file  Social Connections: Not on file  Intimate Partner Violence: Not on file  Depression (PHQ2-9): Low Risk (07/26/2021)   Depression (PHQ2-9)    PHQ-2 Score: 1  Alcohol Screen: Not on file  Housing: Not on file  Utilities: Not on file  Health Literacy: Not on file    Current Medications[1]  EXAM:  VITALS per patient if applicable:Ht 5' 6.5 (1.689 m)   Wt 177 lb (80.3 kg)  BMI 28.14 kg/m   GENERAL: alert, oriented, appears well and in no acute distress  HEENT: atraumatic, conjunctiva clear, no obvious abnormalities on inspection of external nose and ears  NECK: normal movements of the head and neck  LUNGS: on inspection no signs of respiratory distress, breathing rate appears normal, no obvious gross SOB, gasping or wheezing  CV: no obvious cyanosis  MS: moves all visible extremities without noticeable abnormality  PSYCH/NEURO: pleasant and cooperative, no obvious depression or anxiety, speech and thought processing grossly intact  ASSESSMENT AND  PLAN:  Discussed the following assessment and plan:  URI, acute Symptoms suggests a viral etiology, I explained patient that symptomatic treatment is recommended in this case, so I do not think abx is needed at this time. Recommend performing a home COVID-19 test to let me know if positive. Monitor for new symptoms. She is concerned about a more serious infectious process, offered chest x-ray, she prefers to hold on this until Friday, when she will let me know if she is not feeling better. Contact precautions discussed. Planning on traveling this coming week. Due to congestion she may develop ear discomfort during flight; so recommend a decongestant when flying, chewing gum, and auto inflation maneuvers. I also explained that cough and nasal congestion can last a few days and sometimes weeks. F/U as needed.  We discussed possible serious and likely etiologies, options for evaluation and workup, limitations of telemedicine visit vs in person visit, treatment, treatment risks and precautions. The patient was advised to call back or seek an in-person evaluation if the symptoms worsen or if the condition fails to improve as anticipated. I discussed the assessment and treatment plan with the patient. The patient was provided an opportunity to ask questions and all were answered. The patient agreed with the plan and demonstrated an understanding of the instructions.  Return if symptoms worsen or fail to improve.  Winn Muehl, MD      [1]  Current Outpatient Medications:    sertraline (ZOLOFT) 25 MG tablet, Take 25 mg by mouth daily. (Patient not taking: Reported on 04/29/2023), Disp: , Rfl:    Vitamin D , Ergocalciferol , (DRISDOL ) 1.25 MG (50000 UT) CAPS capsule, Take 1 capsule (50,000 Units total) by mouth every 7 (seven) days., Disp: 12 capsule, Rfl: 0
